# Patient Record
Sex: Male | Born: 1955 | Race: Black or African American | Hispanic: No | Marital: Married | Smoking: Never smoker
Health system: Southern US, Community
[De-identification: ages and names within clinical notes are randomized; demographics above are authoritative.]

## PROBLEM LIST (undated history)

## (undated) DIAGNOSIS — I10 Essential (primary) hypertension: Secondary | ICD-10-CM

---

## 2014-06-15 ENCOUNTER — Emergency Department (HOSPITAL_BASED_OUTPATIENT_CLINIC_OR_DEPARTMENT_OTHER): Payer: Self-pay

## 2014-06-15 ENCOUNTER — Emergency Department (HOSPITAL_BASED_OUTPATIENT_CLINIC_OR_DEPARTMENT_OTHER)
Admission: EM | Admit: 2014-06-15 | Discharge: 2014-06-15 | Disposition: A | Discharge status: Home / Self Care | Payer: Self-pay | Attending: Emergency Medicine | Admitting: Emergency Medicine

## 2014-06-15 ENCOUNTER — Encounter (HOSPITAL_BASED_OUTPATIENT_CLINIC_OR_DEPARTMENT_OTHER): Payer: Self-pay | Admitting: *Deleted

## 2014-06-15 DIAGNOSIS — D696 Thrombocytopenia, unspecified: Secondary | ICD-10-CM | POA: Insufficient documentation

## 2014-06-15 DIAGNOSIS — R319 Hematuria, unspecified: Secondary | ICD-10-CM | POA: Insufficient documentation

## 2014-06-15 DIAGNOSIS — N4889 Other specified disorders of penis: Secondary | ICD-10-CM | POA: Insufficient documentation

## 2014-06-15 DIAGNOSIS — I10 Essential (primary) hypertension: Secondary | ICD-10-CM | POA: Insufficient documentation

## 2014-06-15 HISTORY — DX: Essential (primary) hypertension: I10

## 2014-06-15 LAB — CBC WITH DIFFERENTIAL/PLATELET
BAND NEUTROPHILS: 7 % (ref 0–10)
BASOS ABS: 0 10*3/uL (ref 0.0–0.1)
BASOS PCT: 0 % (ref 0–1)
BLASTS: 0 %
Eosinophils Absolute: 0 10*3/uL (ref 0.0–0.7)
Eosinophils Relative: 0 % (ref 0–5)
HEMATOCRIT: 41.4 % (ref 39.0–52.0)
HEMOGLOBIN: 14.4 g/dL (ref 13.0–17.0)
LYMPHS PCT: 21 % (ref 12–46)
Lymphs Abs: 2.7 10*3/uL (ref 0.7–4.0)
MCH: 29.7 pg (ref 26.0–34.0)
MCHC: 34.8 g/dL (ref 30.0–36.0)
MCV: 85.4 fL (ref 78.0–100.0)
MONOS PCT: 2 % — AB (ref 3–12)
Metamyelocytes Relative: 0 %
Monocytes Absolute: 0.3 10*3/uL (ref 0.1–1.0)
Myelocytes: 0 %
NEUTROS ABS: 9.9 10*3/uL — AB (ref 1.7–7.7)
NEUTROS PCT: 70 % (ref 43–77)
PROMYELOCYTES ABS: 0 %
Platelets: 93 10*3/uL — ABNORMAL LOW (ref 150–400)
RBC: 4.85 MIL/uL (ref 4.22–5.81)
RDW: 16.9 % — ABNORMAL HIGH (ref 11.5–15.5)
WBC: 12.9 10*3/uL — AB (ref 4.0–10.5)
nRBC: 0 /100 WBC

## 2014-06-15 LAB — URINE MICROSCOPIC-ADD ON

## 2014-06-15 LAB — URINALYSIS, ROUTINE W REFLEX MICROSCOPIC
Bilirubin Urine: NEGATIVE
Glucose, UA: NEGATIVE mg/dL
KETONES UR: NEGATIVE mg/dL
LEUKOCYTES UA: NEGATIVE
Nitrite: NEGATIVE
PH: 6.5 (ref 5.0–8.0)
Protein, ur: NEGATIVE mg/dL
SPECIFIC GRAVITY, URINE: 1.017 (ref 1.005–1.030)
Urobilinogen, UA: 1 mg/dL (ref 0.0–1.0)

## 2014-06-15 LAB — BASIC METABOLIC PANEL
ANION GAP: 8 (ref 5–15)
BUN: 21 mg/dL (ref 6–23)
CO2: 29 mmol/L (ref 19–32)
Calcium: 9 mg/dL (ref 8.4–10.5)
Chloride: 104 mmol/L (ref 96–112)
Creatinine, Ser: 0.97 mg/dL (ref 0.50–1.35)
GFR calc Af Amer: >90 mL/min (ref >90)
GFR calc non Af Amer: 88 mL/min — ABNORMAL LOW (ref >90)
Glucose, Bld: 97 mg/dL (ref 70–99)
Potassium: 3.2 mmol/L — ABNORMAL LOW (ref 3.5–5.1)
SODIUM: 141 mmol/L (ref 135–145)

## 2014-06-15 MED ORDER — KETOROLAC TROMETHAMINE 30 MG/ML IJ SOLN
30.0000 mg | Freq: Once | INTRAMUSCULAR | Status: AC
  Administered 2014-06-15: 30 mg via INTRAVENOUS

## 2014-06-15 MED ORDER — KETOROLAC TROMETHAMINE 30 MG/ML IJ SOLN
INTRAMUSCULAR | Status: AC
  Filled 2014-06-15: qty 1

## 2014-06-15 MED ORDER — IBUPROFEN 800 MG PO TABS
800.0000 mg | ORAL_TABLET | Freq: Once | ORAL | Status: DC
  Filled 2014-06-15: qty 1

## 2014-06-15 MED ORDER — KETOROLAC TROMETHAMINE 60 MG/2ML IM SOLN: 60.0000 mg | Freq: Once | INTRAMUSCULAR | Status: DC

## 2014-06-15 NOTE — ED Provider Notes (Signed)
CSN: [id number]     Arrival date & time 06/15/14  0217 History   First MD Initiated Contact with Patient 06/15/14 0234     Chief Complaint  Patient presents with  . Hematuria     (Consider location/radiation/quality/duration/timing/severity/associated sxs/prior Treatment) Patient is a 59 y.o. male presenting with hematuria. The history is provided by the patient.  Hematuria This is a new problem. The current episode started yesterday. The problem occurs constantly. The problem has not changed since onset.Pertinent negatives include no chest pain, no abdominal pain, no headaches and no shortness of breath. Nothing aggravates the symptoms. Nothing relieves the symptoms. He has tried nothing for the symptoms. The treatment provided no relief.  Came here from [place] on yesterday  Past Medical History  Diagnosis Date  . Hypertension    History reviewed. No pertinent past surgical history. History reviewed. No pertinent family history. History  Substance Use Topics  . Smoking status: Never Smoker   . Smokeless tobacco: Never Used  . Alcohol Use: Yes    Review of Systems  Respiratory: Negative for shortness of breath.   Cardiovascular: Negative for chest pain.  Gastrointestinal: Negative for abdominal pain.  Genitourinary: Positive for hematuria and penile pain. Negative for dysuria, flank pain and discharge.  Neurological: Negative for headaches.  All other systems reviewed and are negative.     Allergies  Review of patient's allergies indicates no known allergies.  Home Medications   Prior to Admission medications   Not on File   BP 145/84 mmHg  Pulse 68  Temp(Src) 98.3 F (36.8 C) (Oral)  Resp 18  Ht 5\' 6"  (1.676 m)  Wt 191 lb 12.8 oz (87 kg)  BMI 30.97 kg/m2  SpO2 98% Physical Exam  Constitutional: He is oriented to person, place, and time. He appears well-developed and well-nourished. No distress.  HENT:  Head: Normocephalic and atraumatic.  Mouth/Throat:  Oropharynx is clear and moist.  Eyes: Conjunctivae are normal. Pupils are equal, round, and reactive to light.  Neck: Normal range of motion. Neck supple.  Cardiovascular: Normal rate, regular rhythm and intact distal pulses.   Pulmonary/Chest: Effort normal and breath sounds normal. He has no wheezes. He has no rales.  Abdominal: Soft. Bowel sounds are normal. There is no tenderness. There is no rebound and no guarding.  Musculoskeletal: Normal range of motion.  Neurological: He is alert and oriented to person, place, and time.  Skin: Skin is warm.  Psychiatric: He has a normal mood and affect.    ED Course  Procedures (including critical care time) Labs Review Labs Reviewed  URINALYSIS, ROUTINE W REFLEX MICROSCOPIC - Abnormal; Notable for the following:    Hgb urine dipstick LARGE (*)    All other components within normal limits  CBC WITH DIFFERENTIAL/PLATELET - Abnormal; Notable for the following:    WBC 12.9 (*)    RDW 16.9 (*)    Platelets 93 (*)    Monocytes Relative 2 (*)    Neutro Abs 9.9 (*)    All other components within normal limits  BASIC METABOLIC PANEL - Abnormal; Notable for the following:    Potassium 3.2 (*)    GFR calc non Af Amer 88 (*)    All other components within normal limits  URINE MICROSCOPIC-ADD ON    Imaging Review Ct Renal Stone Study  06/15/2014   CLINICAL DATA:  Painful bloody urination.  Evaluate for stone  EXAM: CT ABDOMEN AND PELVIS WITHOUT CONTRAST  TECHNIQUE: Multidetector CT imaging of the  abdomen and pelvis was performed following the standard protocol without IV contrast.  COMPARISON:  None.  FINDINGS: BODY WALL: Injection granulomas in the right gluteal region.  LOWER CHEST: No contributory findings.  ABDOMEN/PELVIS:  Liver: Small scattered low densities in the liver are most consistent with cysts.  Biliary: No evidence of biliary obstruction or stone.  Pancreas: Unremarkable.  Spleen: Unremarkable.  Adrenals: Unremarkable.  Kidneys and  ureters: No hydronephrosis or stone. Presumed 1 cm cyst from the interpolar left kidney.  Bladder: Wall thickness within normal limits for partial distention.  Reproductive: Normal for age  Bowel: No obstruction. Stool retention, incidental based on the history. Negative appendix.  Retroperitoneum: No mass or adenopathy.  Peritoneum: No ascites or pneumoperitoneum.  Vascular: No acute abnormality.  OSSEOUS: No acute abnormalities.  IMPRESSION: No urolithiasis or other explanation for hematuria.   Electronically Signed   By: Marnee Spring M.D.   On: 06/15/2014 04:44     EKG Interpretation None      MDM   Final diagnoses:  Hematuria  Thrombocytopenia    Will refer to urology for ongoing work up of hematuria perhaps is a passed stone.  Will refer to PMD for work up of thrombocytopenia.      Cy Blamer, MD 06/15/14 [id number]

## 2014-06-15 NOTE — Discharge Instructions (Signed)
°Emergency Department Resource Guide °1) Find a Doctor and Pay Out of Pocket °Although you won't have to find out who is covered by your insurance plan, it is a good idea to ask around and get recommendations. You will then need to call the office and see if the doctor you have chosen will accept you as a new patient and what types of options they offer for patients who are self-pay. Some doctors offer discounts or will set up payment plans for their patients who do not have insurance, but you will need to ask so you aren't surprised when you get to your appointment. ° °2) Contact Your Local Health Department °Not all health departments have doctors that can see patients for sick visits, but many do, so it is worth a call to see if yours does. If you don't know where your local health department is, you can check in your phone book. The CDC also has a tool to help you locate your state's health department, and many state websites also have listings of all of their local health departments. ° °3) Find a Walk-in Clinic °If your illness is not likely to be very severe or complicated, you may want to try a walk in clinic. These are popping up all over the country in pharmacies, drugstores, and shopping centers. They're usually staffed by nurse practitioners or physician assistants that have been trained to treat common illnesses and complaints. They're usually fairly quick and inexpensive. However, if you have serious medical issues or chronic medical problems, these are probably not your best option. ° °No Primary Care Doctor: °- Call [facility] at  832-8000 - they can help you locate a primary care doctor that  accepts your insurance, provides certain services, etc. °- Physician Referral Service- 1-800-533-3463 ° °Chronic Pain Problems: °Organization         Address  Phone   Notes  °[facility]  (336) 297-2271 Patients need to be referred by their primary care doctor.  ° °Medication  Assistance: °Organization         Address  Phone   Notes  °[place] [facility] [address] °[place], [place] [address] (336) 641-8030 --Must be a resident of [place] °-- Must have NO insurance coverage whatsoever (no Medicaid/ Medicare, etc.) °-- The pt. MUST have a primary care doctor that directs their care regularly and follows them in the community °  °[facility]  (866) 331-1348   °[employer]  (888) 892-1162   ° °Agencies that provide inexpensive medical care: °Organization         Address  Phone   Notes  °[facility]  (336) 832-8035   °[facility]    (336) 832-7272   °[facility] [address] °[place], [place] [address] (336) 832-4777   °[facility] [address], °[place] (336) 271-4999   °[facility]    (336) 373-0678   °[facility]    (336) 272-1050   °[facility] ° [address], [place] Phone:  (336) 832-4444, Fax:  (336) 832-4440 Hours of Operation:  9 am - 6 pm, M-F.  Also accepts Medicaid/Medicare and self-pay.  °[facility] ° [address],  Phone: (336) 832-3150, Fax: (336) [phone]. Hours of Operation:  8:30 am - 5:30 pm, M-F.  Also accepts Medicaid and self-pay.  °[facility] [place] [address], [place] Phone: (336) 878-6027   °[facility] [address], [place], [place] (336)723-1848, Ext. 123 Mondays & Thursdays: 7-9 AM.  First 15 patients are seen on a first come, first serve basis. °  ° °Medicaid-accepting [place] Providers: ° °Organization         Address  Phone   Notes  °[facility] [address], [place] (336) 641-2100 Also accepts self-pay patients.  °[facility] [address], [place] ° (336) 856-9996   °[facility] [address], [place]  (336) 288-8857   °[facility] [address] [place] [address], [place] (336) 299-7000   °Veita Bland [address], [place]  ° (336) 373-1557 Only accepts [place] Access Medicaid patients after they have their name applied to their card.  ° °Self-Pay (no insurance) in [place]: ° °Organization         Address  Phone   Notes  °Sickle Cell Patients, [facility] [address], [place] (336) 832-1970   °[facility] [address], [place] (336) 832-4400   °[facility] ° [address], [place] (336) 992-4800   °[facility]/Dr. Osei-Bonsu ° [address] [place] [address], [place] or [address], [place] (336) [phone] Phone number for both [place] and [place] locations is the same.  °[facility] [address], [place] (336) 299-0000   °[facility] [address] [place] [address], [place] or [address] (336) 852-7530 °(336) 878-2260   °[facility] [address], [place] (336) 350-1642, phone; (336) 294-5005, fax Sees patients 1st and 3rd Saturday of every month.  Must not qualify for public or private insurance (i.e. Medicaid, Medicare, [place] Health Choice, Veterans' Benefits) • Household income should be no more than 200% of the poverty level •The clinic cannot treat you if you are pregnant or think you are pregnant • Sexually transmitted diseases are not treated at the clinic.  ° ° °Dental Care: °Organization         Address  Phone  Notes  °[place] [facility] [address], [place] (336) 641-6152 Accepts children up to age 21 who are enrolled in Medicaid or [place] Health Choice; pregnant women with a Medicaid card; and children who have applied for Medicaid or [place] Health Choice, but were declined, whose parents can pay a reduced fee at time of service.  °[place]  [facility] [place]  [address], [place] (336) 641-7733 Accepts children up to age 21 who are enrolled in Medicaid or [employer] Health Choice; pregnant women with a Medicaid card; and children who have applied for Medicaid or [place] Health Choice, but were declined, whose parents can pay a reduced fee at time of service.  °[facility] ° [address], [place] (336) 641-4533 Patients are seen by appointment only. Walk-ins are not accepted. [facility] will see patients 18 years of age and older. °Monday - Tuesday (8am-5pm) °Most Wednesdays (8:30-5pm) °$30 per visit, cash only  °[facility] ° [address], [place] (336) 641-4533 Patients are seen by appointment only. Walk-ins are not accepted. [facility] will see patients 18 years of age and older. °One   Wednesday Evening (Monthly: Volunteer Based).  $30 per visit, cash only  °[facility]  (919) 537-3737 for adults; Children under age 4, call Graduate Pediatric Dentistry at ([phone]) [phone]. Children aged 4-14, please call (919) [phone] to request a pediatric application. ° Dental services are provided in all areas of dental care including fillings, crowns and bridges, complete and partial dentures, implants, gum treatment, root canals, and extractions. Preventive care is also provided. Treatment is provided to both adults and children. °Patients are selected via a lottery and there is often a waiting list. °  °[facility] [address], °[place] ° (336) 763-8833 [email or web address] °  °[facility] [address], [place], [place] (336)723-1848, Ext. 123 Second and Fourth Thursday of each month, opens at 6:30 AM; Clinic ends at 9 AM.  Patients are seen on a first-come first-served basis, and a limited number are seen during each clinic.  ° °[facility] ° [address], [place], [place] (336) 723-7904    Eligibility Requirements °You must have lived in [place], [place], or [place] counties for at least the last three months. °  You cannot be eligible for state or federal sponsored healthcare insurance, including Veterans Administration, Medicaid, or Medicare. °  You generally cannot be eligible for healthcare insurance through your employer.  °  How to apply: °Eligibility screenings are held every Tuesday and Wednesday afternoon from 1:00 pm until 4:00 pm. You do not need an appointment for the interview!  °[facility] [address], [place], [place] 336-631-2330   °[facility]  336-342-8273   °[facility]  336-703-3100   °[facility]  336-570-6415   ° °Behavioral Health Resources in the Community: °Intensive Outpatient Programs °Organization         Address  Phone  Notes  °[facility] [address], [place], [place] 336-878-6098   °[facility] [address], [place], [place] 336-832-9800   °[facility] [address], [place], [place] ° 336-882-2125   °[facility] [address],  °[place], [place] 1-800-853-5163 or 336-641-4981   °Substance Abuse Resources °Organization         Address  Phone  Notes  °[facility]  336-882-2125   °[facility]  336-784-9470   °[facility]  336-285-9073   °[facility]  336-845-3988   °Residential & Outpatient Substance Abuse Program  1-800-659-3381   °Psychological Services °Organization         Address  Phone  Notes  °[facility]  336- 832-9600   °[facility]  336- 378-7881   °[facility] [address], [place] 1-800-853-5163 or 336-641-4981   ° °Mobile Crisis Teams °Organization         Address  Phone  Notes  °[facility], Mobile Crisis Care Unit  1-877-626-1772   °Assertive °[facility] ° [address]  [place], [place] 336-834-9664   °Sharon DeEsch [address] °[place] 336-554-5454   ° °Self-Help/Support Groups °Organization         Address  Phone             Notes  °[facility] - variety of support groups  336- 373-1402 Call for more information  °Narcotics Anonymous (NA), [facility] [address], °[place]  2 meetings at this location  ° °  Residential Treatment Programs °Organization         Address  Phone  Notes  °[facility] [address],    °[place]  1-866-801-8205   °[facility] ° [address], [place], [place] 704-293-8524   °[facility] [address], [place] 336-845-3988 Admissions: 8am-3pm M-F  °[facility] [address]    °[place], [place] 336-841-1104   °[facility] [address], [place], [place] 336-379-7146   °[facility] [address]  °[place], [place] 336-285-9073   °[facility] - Intensive Outpatient [address], [place], [place] 336-852-3033   °[facility] [address]  °[place], [place] 1-877-615-2722 or 336-784-9470   °[facility] [address] [place], [place] 336-227-7417 Accepts Medicaid  °[facility] [address]  °[place] 1-800-659-3381 Substance Abuse/Addiction Treatment  ° °[place] Behavioral Health Resources °Organization         Address  Phone  Notes  °[facility]  (888) 581-9988   °Julie Brannon, PhD [address] [place], [place]   (336) 349-5553 or (336) 951-0000   °[facility]   [address] °[place], [place] (336) 349-4454   °[facility] [address], [place], [place] (336) 342-8316 Insurance/Medicaid/sponsorship through [facility]  °[facility] [address]                                    [place], [place] (336) 342-8316 Therapy/tele-psych/case    °[facility] [address]  ° [place], [place] (336) 349-2233    °Dr. Arfeen  (336) 349-4544   °[facility] [place]  [employer] [place] [facility] 1) [address], [place] °2) [address], [place] °3)  [address], [place] (336) 349-3220 °(336) 342-7768 ° °(336) 342-8140   °[place] [facility] (336) 342-1394 or (336) 342-3537 (After Hours)    ° ° °

## 2014-06-15 NOTE — ED Notes (Signed)
C/o blood in urine onset yesterday am,  Painful urination

## 2014-06-15 NOTE — ED Notes (Signed)
C/o blood in urine onset yesterday am,  w some pain w urination

## 2016-05-09 IMAGING — CT CT RENAL STONE PROTOCOL
2 of 4 series · 17 of 46 positions shown, 19 images · non-contrast
Comparison: None.

CLINICAL DATA: Painful bloody urination.  Evaluate for stone

EXAM:
CT ABDOMEN AND PELVIS WITHOUT CONTRAST
TECHNIQUE: Multidetector CT imaging of the abdomen and pelvis was performed
following the standard protocol without IV contrast.

[Series 2: renal stone > 200 lbs 5.0 b31f · axial · 0.76mm/px · z∈[-645,-235]mm · 14 of 90 slices shown, 16 images]
[im 4/90  soft-tissue]
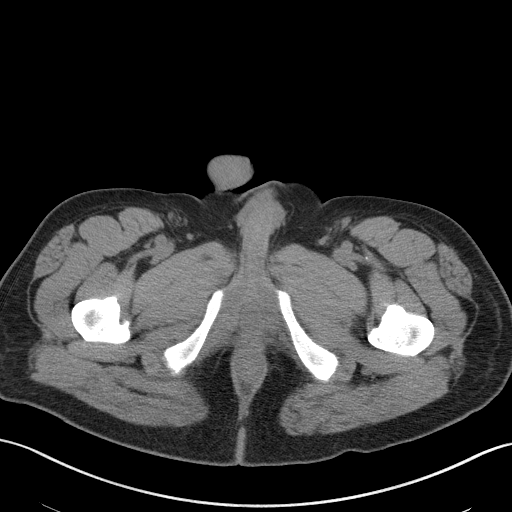
[im 4/90  bone]
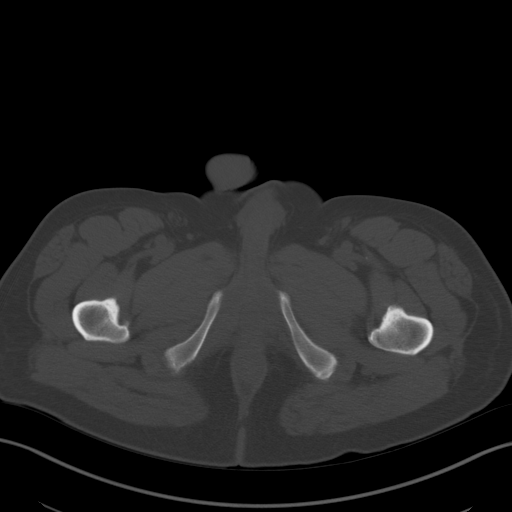
[im 12/90  soft-tissue]
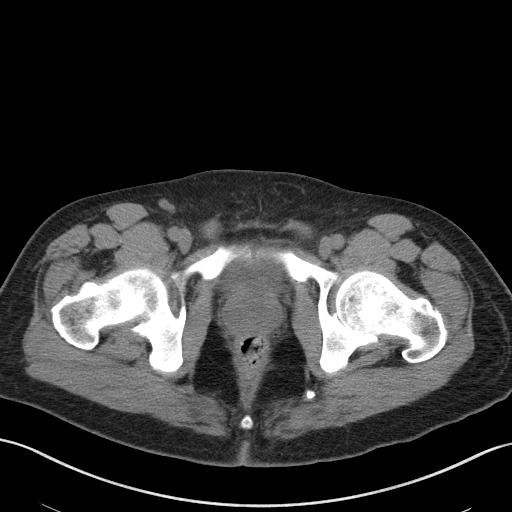
[im 16/90  soft-tissue]
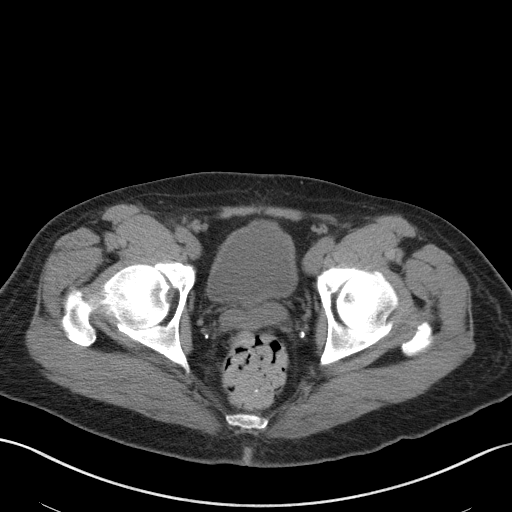
[im 24/90  soft-tissue]
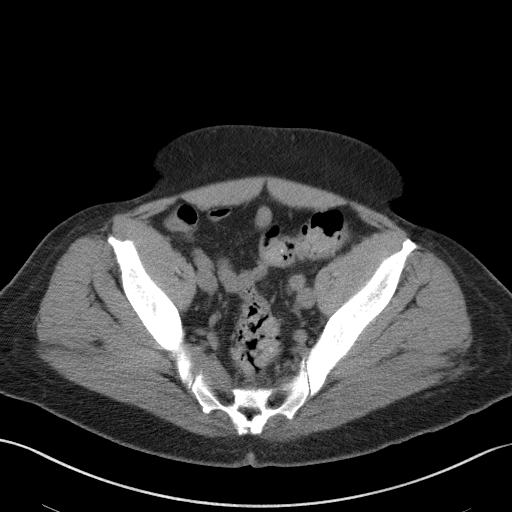
[im 31/90  soft-tissue]
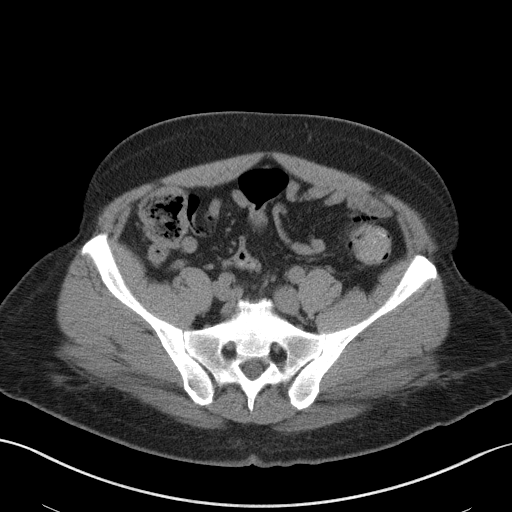
[im 35/90  soft-tissue]
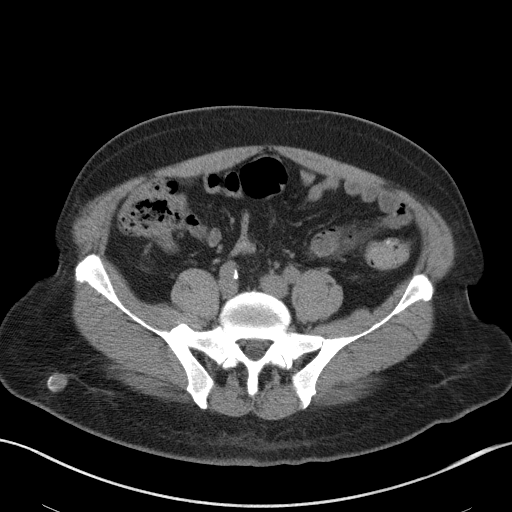
[im 43/90  soft-tissue]
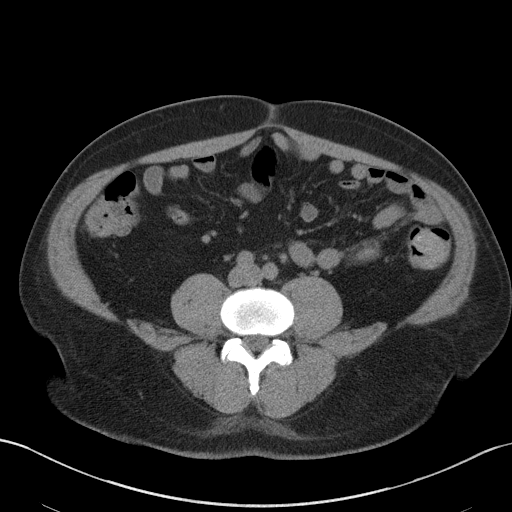
[im 47/90  soft-tissue]
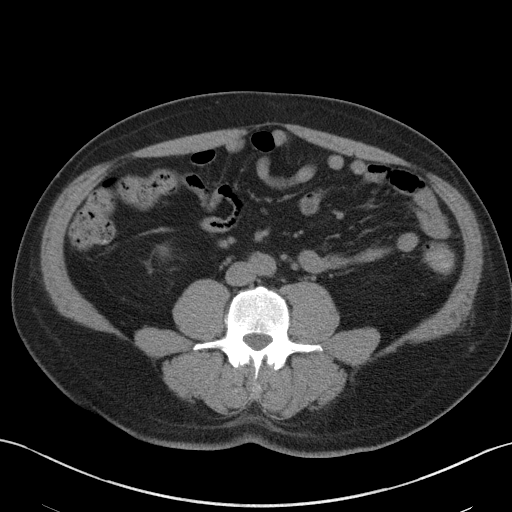
[im 55/90  soft-tissue]
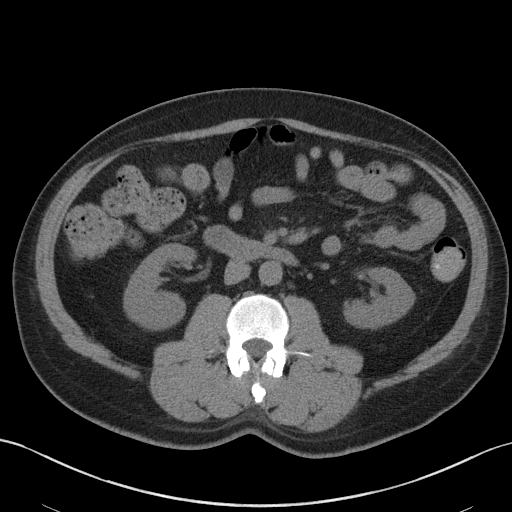
[im 55/90  bone]
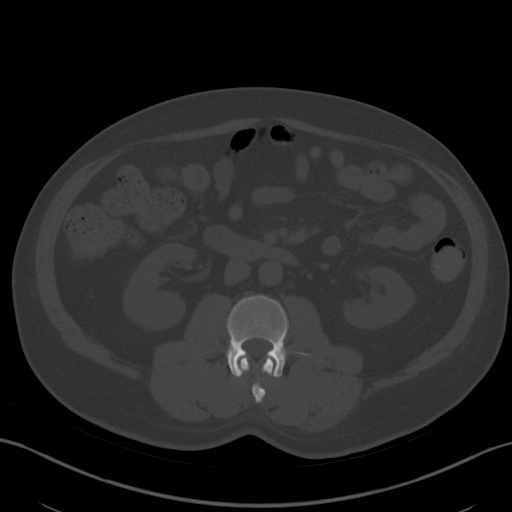
[im 59/90  soft-tissue]
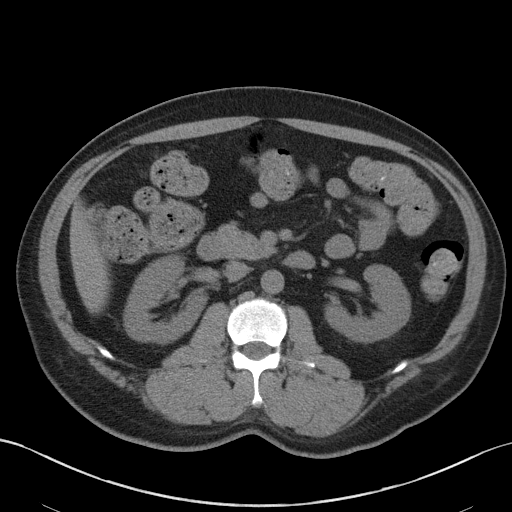
[im 66/90  soft-tissue]
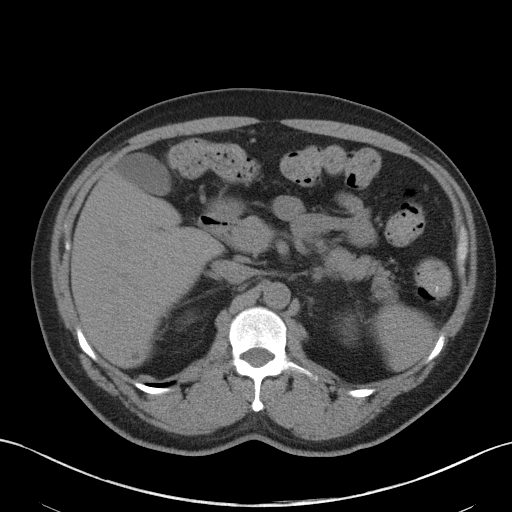
[im 74/90  soft-tissue]
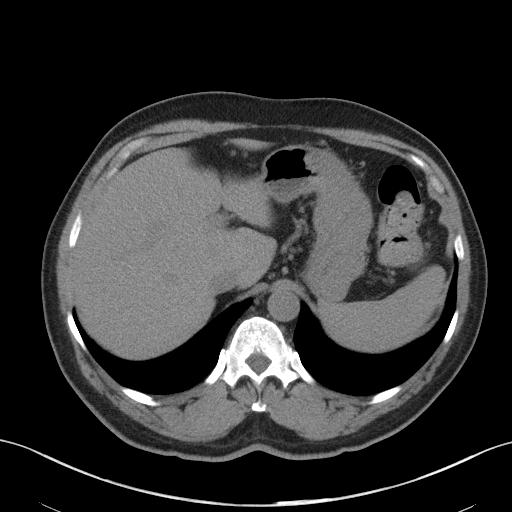
[im 78/90  soft-tissue]
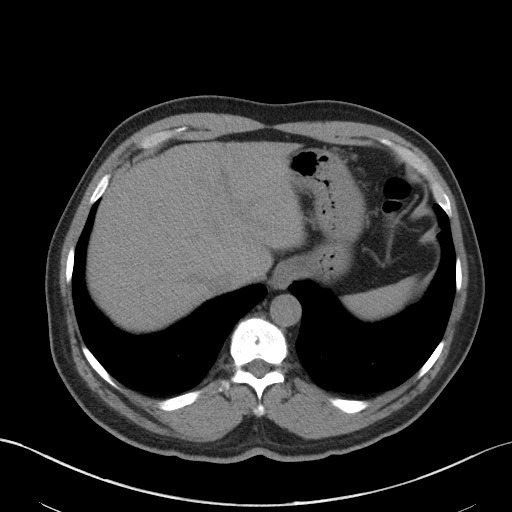
[im 86/90  soft-tissue]
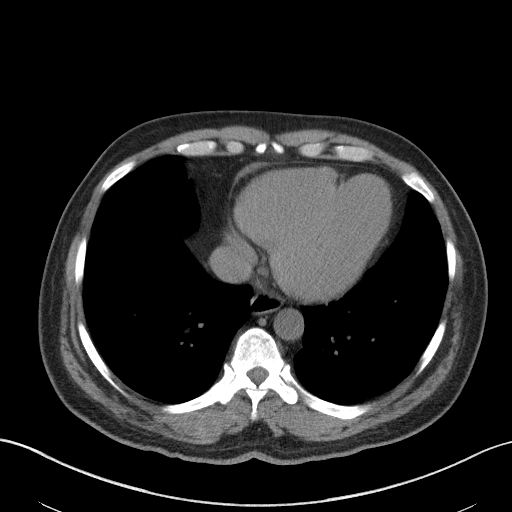

[Series 5: renal stone 3.0 coronal · coronal · 0.79mm/px · 3 of 90 slices shown]
[im 30/90  soft-tissue]
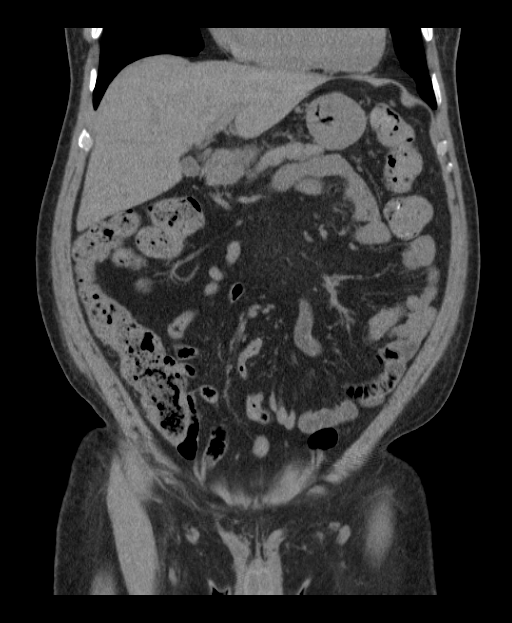
[im 40/90  soft-tissue]
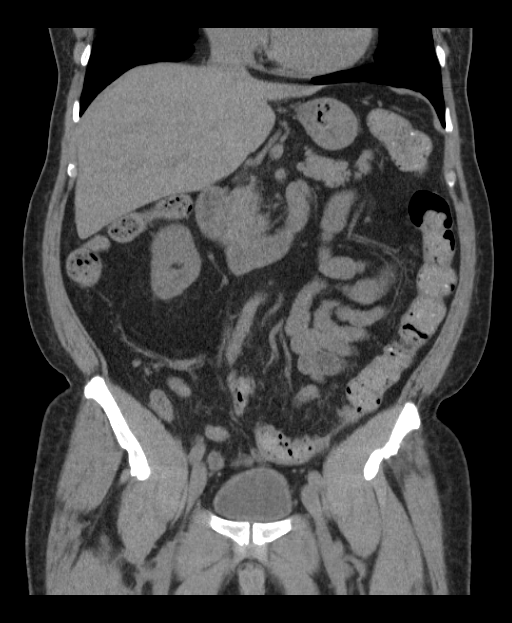
[im 50/90  soft-tissue]
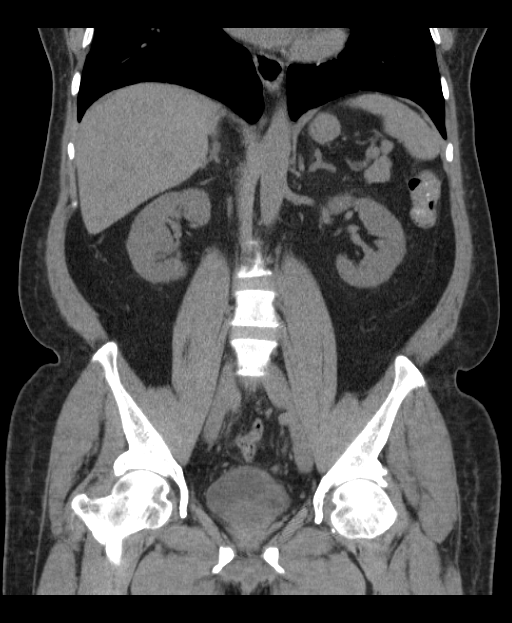

[17 of 46 positions shown; findings below may reference images not displayed]

FINDINGS: BODY WALL: Injection granulomas in the right gluteal region.

LOWER CHEST: No contributory findings.

ABDOMEN/PELVIS:

Liver: Small scattered low densities in the liver are most
consistent with cysts.

Biliary: No evidence of biliary obstruction or stone.

Pancreas: Unremarkable.

Spleen: Unremarkable.

Adrenals: Unremarkable.

Kidneys and ureters: No hydronephrosis or stone. Presumed 1 cm cyst
from the interpolar left kidney.

Bladder: Wall thickness within normal limits for partial distention.

Reproductive: Normal for age

Bowel: No obstruction. Stool retention, incidental based on the
history. Negative appendix.

Retroperitoneum: No mass or adenopathy.

Peritoneum: No ascites or pneumoperitoneum.

Vascular: No acute abnormality.

OSSEOUS: No acute abnormalities.
IMPRESSION: No urolithiasis or other explanation for hematuria.
